# Patient Record
Sex: Female | Born: 1935 | Race: White | Hispanic: No | Marital: Married | Smoking: Former smoker
Health system: Southern US, Community
[De-identification: ages and names within clinical notes are randomized; demographics above are authoritative.]

## PROBLEM LIST (undated history)

## (undated) DIAGNOSIS — E079 Disorder of thyroid, unspecified: Secondary | ICD-10-CM

## (undated) DIAGNOSIS — C801 Malignant (primary) neoplasm, unspecified: Secondary | ICD-10-CM

## (undated) DIAGNOSIS — H269 Unspecified cataract: Secondary | ICD-10-CM

## (undated) DIAGNOSIS — I10 Essential (primary) hypertension: Secondary | ICD-10-CM

## (undated) DIAGNOSIS — K5790 Diverticulosis of intestine, part unspecified, without perforation or abscess without bleeding: Secondary | ICD-10-CM

## (undated) DIAGNOSIS — K635 Polyp of colon: Secondary | ICD-10-CM

## (undated) DIAGNOSIS — K5792 Diverticulitis of intestine, part unspecified, without perforation or abscess without bleeding: Secondary | ICD-10-CM

## (undated) HISTORY — DX: Polyp of colon: K63.5

## (undated) HISTORY — DX: Diverticulosis of intestine, part unspecified, without perforation or abscess without bleeding: K57.90

## (undated) HISTORY — DX: Disorder of thyroid, unspecified: E07.9

## (undated) HISTORY — DX: Essential (primary) hypertension: I10

## (undated) HISTORY — PX: COLONOSCOPY: SHX174

## (undated) HISTORY — DX: Malignant (primary) neoplasm, unspecified: C80.1

## (undated) HISTORY — DX: Diverticulitis of intestine, part unspecified, without perforation or abscess without bleeding: K57.92

## (undated) HISTORY — PX: POLYPECTOMY: SHX149

## (undated) HISTORY — DX: Unspecified cataract: H26.9

---
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
## 1974-04-18 HISTORY — PX: BREAST EXCISIONAL BIOPSY: SUR124

## 1979-04-19 HISTORY — PX: ABDOMINAL HYSTERECTOMY: SHX81

## 1998-11-04 ENCOUNTER — Inpatient Hospital Stay (HOSPITAL_COMMUNITY): Admission: EM | Admit: 1998-11-04 | Discharge: 1998-11-10 | Payer: Self-pay | Admitting: Emergency Medicine

## 1998-11-05 ENCOUNTER — Encounter: Payer: Self-pay | Admitting: General Surgery

## 1998-11-09 ENCOUNTER — Encounter: Payer: Self-pay | Admitting: *Deleted

## 2000-04-18 HISTORY — PX: COLON RESECTION: SHX5231

## 2000-05-03 ENCOUNTER — Encounter: Payer: Self-pay | Admitting: *Deleted

## 2000-05-03 ENCOUNTER — Encounter: Admission: RE | Admit: 2000-05-03 | Discharge: 2000-05-03 | Payer: Self-pay | Admitting: *Deleted

## 2001-01-16 HISTORY — PX: COLON RESECTION: SHX5231

## 2001-03-20 ENCOUNTER — Other Ambulatory Visit: Admission: RE | Admit: 2001-03-20 | Discharge: 2001-03-20 | Payer: Self-pay | Admitting: *Deleted

## 2001-05-03 ENCOUNTER — Encounter: Payer: Self-pay | Admitting: *Deleted

## 2001-05-03 ENCOUNTER — Encounter: Admission: RE | Admit: 2001-05-03 | Discharge: 2001-05-03 | Payer: Self-pay | Admitting: *Deleted

## 2001-09-12 ENCOUNTER — Other Ambulatory Visit: Admission: RE | Admit: 2001-09-12 | Discharge: 2001-09-12 | Payer: Self-pay | Admitting: *Deleted

## 2002-03-27 ENCOUNTER — Other Ambulatory Visit: Admission: RE | Admit: 2002-03-27 | Discharge: 2002-03-27 | Payer: Self-pay

## 2002-05-06 ENCOUNTER — Encounter: Admission: RE | Admit: 2002-05-06 | Discharge: 2002-05-06 | Payer: Self-pay

## 2003-06-20 ENCOUNTER — Encounter: Admission: RE | Admit: 2003-06-20 | Discharge: 2003-06-20 | Payer: Self-pay | Admitting: Family Medicine

## 2003-12-30 ENCOUNTER — Other Ambulatory Visit: Admission: RE | Admit: 2003-12-30 | Discharge: 2003-12-30 | Payer: Self-pay | Admitting: Family Medicine

## 2004-06-22 ENCOUNTER — Encounter: Admission: RE | Admit: 2004-06-22 | Discharge: 2004-06-22 | Payer: Self-pay | Admitting: Family Medicine

## 2004-10-31 ENCOUNTER — Emergency Department (HOSPITAL_COMMUNITY): Admission: EM | Admit: 2004-10-31 | Discharge: 2004-10-31 | Payer: Self-pay | Admitting: Emergency Medicine

## 2005-07-18 ENCOUNTER — Encounter: Admission: RE | Admit: 2005-07-18 | Discharge: 2005-07-18 | Payer: Self-pay | Admitting: Family Medicine

## 2006-08-10 ENCOUNTER — Encounter: Admission: RE | Admit: 2006-08-10 | Discharge: 2006-08-10 | Payer: Self-pay | Admitting: Family Medicine

## 2007-02-28 ENCOUNTER — Ambulatory Visit: Payer: Self-pay | Admitting: Vascular Surgery

## 2007-08-13 ENCOUNTER — Encounter: Admission: RE | Admit: 2007-08-13 | Discharge: 2007-08-13 | Payer: Self-pay | Admitting: Family Medicine

## 2008-08-19 ENCOUNTER — Encounter: Admission: RE | Admit: 2008-08-19 | Discharge: 2008-08-19 | Payer: Self-pay | Admitting: Family Medicine

## 2008-12-17 HISTORY — PX: VAGINAL PROLAPSE REPAIR: SHX830

## 2009-08-20 ENCOUNTER — Encounter: Admission: RE | Admit: 2009-08-20 | Discharge: 2009-08-20 | Payer: Self-pay | Admitting: Family Medicine

## 2010-07-26 ENCOUNTER — Other Ambulatory Visit: Payer: Self-pay | Admitting: Specialist

## 2010-07-26 ENCOUNTER — Other Ambulatory Visit: Payer: Self-pay | Admitting: Family Medicine

## 2010-07-26 DIAGNOSIS — Z1231 Encounter for screening mammogram for malignant neoplasm of breast: Secondary | ICD-10-CM

## 2010-07-30 ENCOUNTER — Other Ambulatory Visit: Payer: Self-pay | Admitting: Dermatology

## 2010-08-31 NOTE — Assessment & Plan Note (Signed)
OFFICE VISIT   Susan Logan, Susan Logan  DOB:  06/16/1935                                       02/28/2007  EAVWU#:[id number]   The patient is a 75 year old female who has had mild to moderate right  calf pain in 3 areas, which has been lasting for the last 6 to 8 months.  She states the pain is fairly minor overall, 2 to 3/10.  Pain does not  change with position.  She does not notice any difference in the pain  with elevation.  The pain can occur just as easily with walking as it  can with sitting still.   Atherosclerotic risk factors include hypertension and elevated  cholesterol.  She denies history of diabetes.   PAST MEDICAL HISTORY:  Remarkable for hypothyroidism and otherwise is  fairly unremarkable.   MEDICATIONS:  Include Synthroid 150 mcg per day, Premarin 0.625 mg once  a day, hydrochlorothiazide 25 mg once a day, glucosamine chondroitin  1500/1200 two per day, Centrum Silver 1 a day, selenium 200 mcg once a  day, vitamins D and C, calcium 600 mg 2 per day, fish oil 1000 mg 2 per  day, ginseng and gingko once a day.   She has no known drug allergies.   PAST SURGICAL HISTORY:  She has had an appendectomy and a colon  resection for diverticulitis.   FAMILY HISTORY:  Unremarkable.   SOCIAL HISTORY:  She is married, has 4 children.  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  She is a former smoker who quit in 1989  after 20 years of smoking.  She drinks 1 glass of wine in the evening.   REVIEW OF SYSTEMS:  She is 5 feet 5 inches, 144 pounds.  She denies any history of DVT or thrombophlebitis in her lower  extremities.  Review of systems from a cardiac, pulmonary, GI, GU, neurological,  orthopedic, psychiatric, ENT, and hematologic standpoint are all  negative.   PHYSICAL EXAM:  Blood pressure is 146/87 in the left arm, heart rate is  70 and regular.  HEENT is unremarkable.  Neck has 2+ carotid pulses with  no bruit bilaterally.  2+  carotid pulses.  Chest is clear to  auscultation.  Cardiac exam is regular rate and rhythm.  Abdomen is soft  and nontender, nondistended with normoactive bowel sounds.  She has 2+  brachial, radial, femoral, popliteal, and dorsalis pedis pulses  bilaterally.  She has a few scattered varicosities and reticular veins  around the right calf.  The left calf is similar.  She has a few areas  of denting in the skin and subcutaneous tissues along the right medial  calf.  She has no obvious mass.  She states that the right leg sometimes  is more swollen than the left.  However, they are fairly symmetric  today.  She had a venous duplex exam today, which showed no evidence of  deep or superficial thrombosis of her venous system.  She did have 2  small perforators in the right calf, which were incompetent.   The patient has not had any evidence of DVT.  She may have some mild  pain related to these incompetent perforators.  However, I would not  consider any intervention, as these are fairly minor in nature.  I  believe the best course of  action is compression stockings for both  lower extremities.  She should get some symptomatic relief from this.  I  have also encouraged her that she can take a nonsteroidal  antiinflammatory such as ibuprofen if she has flare-ups of pain.  I have  also encouraged her to continue to elevate her legs at the end of the  day for symptomatic relief.  She will follow up on an as needed basis.   Susan Hora. Fields, MD  Electronically Signed   CEF/MEDQ  D:  02/28/2007  T:  03/01/2007  Job:  [id number]   cc:   Talmadge Coventry, M.D.

## 2010-08-31 NOTE — Procedures (Signed)
DUPLEX DEEP VENOUS EXAM - LOWER EXTREMITY   INDICATION:  Right medial calf pain, left posterior calf pain   HISTORY:  Edema:  No  Trauma/Surgery:  No  Pain:  For about last 9 months, has had right constant medial calf pain  PE:  No  Previous DVT:  No  Anticoagulants:  No   DUPLEX EXAM:                CFV   SFV   PopV  PTV    GSV                R  L  R  L  R  L  R   L  R  L  Thrombosis    o  o  o  o  o  o  o   o  o  o  Spontaneous   +  +  +  +  +  +  +   +  +  +  Phasic        +  +  +  +  +  +  +   +  +  +  Augmentation  +  +  +  +  +  +  +   +  +  +  Compressible  +  +  +  +  +  +  +   +  +  +  Competent     +  +  +  +  +  +  +   +  +  +   Legend:  + - yes  o - no  p - partial  D - decreased   IMPRESSION:  1. The bilateral lower extremity venous system was imaged, Dopplered,      and shows no evidence of DVT or SVT  2. Two small perforators noted in the right medial calf measuring 0.20      cm and 0.25 cm  3. Incidental note:  Triphasic flow noted in bilateral posterior      tibial arteries    _____________________________  Janetta Hora. Fields, MD   AS/MEDQ  D:  02/28/2007  T:  03/01/2007  Job:  [id number]

## 2010-09-07 ENCOUNTER — Ambulatory Visit
Admission: RE | Admit: 2010-09-07 | Discharge: 2010-09-07 | Disposition: A | Discharge status: Home / Self Care | Payer: Medicare Other | Source: Ambulatory Visit | Attending: Family Medicine | Admitting: Family Medicine

## 2010-09-07 DIAGNOSIS — Z1231 Encounter for screening mammogram for malignant neoplasm of breast: Secondary | ICD-10-CM

## 2010-10-17 HISTORY — PX: OTHER SURGICAL HISTORY: SHX169

## 2011-10-10 ENCOUNTER — Other Ambulatory Visit: Payer: Self-pay | Admitting: Family Medicine

## 2011-10-10 DIAGNOSIS — Z1231 Encounter for screening mammogram for malignant neoplasm of breast: Secondary | ICD-10-CM

## 2011-11-11 ENCOUNTER — Ambulatory Visit
Admission: RE | Admit: 2011-11-11 | Discharge: 2011-11-11 | Disposition: A | Discharge status: Home / Self Care | Payer: Medicare Other | Source: Ambulatory Visit | Attending: Family Medicine | Admitting: Family Medicine

## 2011-11-11 DIAGNOSIS — Z1231 Encounter for screening mammogram for malignant neoplasm of breast: Secondary | ICD-10-CM

## 2012-07-02 ENCOUNTER — Encounter: Payer: Self-pay | Admitting: Internal Medicine

## 2012-07-02 ENCOUNTER — Telehealth: Payer: Self-pay | Admitting: Internal Medicine

## 2012-07-02 NOTE — Telephone Encounter (Signed)
The cutoff is generally age 75 or less for first degree relatives, for colonoscopies every 5 years. Regardless, she is due this year and could have it performed any time. Please update her record indicates that her father was diagnosed with colon cancer at age 59. Thank you

## 2012-07-02 NOTE — Telephone Encounter (Signed)
Spoke with pt and she is aware. States she will call back to schedule the colon when she has her calendar.

## 2012-07-02 NOTE — Telephone Encounter (Signed)
Pt states that her father had colon cancer and he was diagnosed at age 75.

## 2012-07-02 NOTE — Telephone Encounter (Signed)
Data entered in epic.

## 2012-07-02 NOTE — Telephone Encounter (Signed)
Pt states that her PCP told her she should have had a colon done 5 years ago due to family history of colon cancer. Pt received a letter stating she was due for a colon recall in September of 2014. Chart pulled and given to Dr. Marina Goodell for review regarding recall date.

## 2012-07-02 NOTE — Telephone Encounter (Signed)
Her chart was reviewed and 2009. Hyperplastic polyp only and 2004. We had no knowledge of a family history of colon cancer documented in her chart, thus the recommendation. What is her family history of colon cancer (what relative?, and diagnosed at what age?). Thanks

## 2012-07-25 ENCOUNTER — Encounter: Payer: Self-pay | Admitting: Internal Medicine

## 2012-08-15 ENCOUNTER — Other Ambulatory Visit: Payer: Self-pay | Admitting: Dermatology

## 2012-08-21 ENCOUNTER — Other Ambulatory Visit: Payer: Self-pay | Admitting: Dermatology

## 2012-08-30 ENCOUNTER — Encounter: Payer: Self-pay | Admitting: Internal Medicine

## 2012-08-30 ENCOUNTER — Ambulatory Visit (AMBULATORY_SURGERY_CENTER): Payer: Medicare Other | Admitting: *Deleted

## 2012-08-30 VITALS — Ht 65.5 in | Wt 156.2 lb

## 2012-08-30 DIAGNOSIS — Z800 Family history of malignant neoplasm of digestive organs: Secondary | ICD-10-CM

## 2012-08-30 DIAGNOSIS — Z1211 Encounter for screening for malignant neoplasm of colon: Secondary | ICD-10-CM

## 2012-08-30 DIAGNOSIS — Z860100 Personal history of colon polyps, unspecified: Secondary | ICD-10-CM

## 2012-08-30 MED ORDER — MOVIPREP 100 G PO SOLR: 1.0000 | Freq: Once | ORAL | Status: DC

## 2012-08-30 NOTE — Progress Notes (Signed)
No soy or egg allergy. ewm No problems with sedation in the past. ewm  no home 02 use. ewm Dr Marina Goodell did past procedures. ewm

## 2012-09-11 ENCOUNTER — Encounter: Payer: Medicare Other | Admitting: Internal Medicine

## 2012-09-17 ENCOUNTER — Encounter: Payer: Medicare Other | Admitting: Internal Medicine

## 2012-09-25 ENCOUNTER — Encounter: Payer: Medicare Other | Admitting: Internal Medicine

## 2012-10-15 ENCOUNTER — Other Ambulatory Visit: Payer: Self-pay

## 2012-10-15 DIAGNOSIS — Z1231 Encounter for screening mammogram for malignant neoplasm of breast: Secondary | ICD-10-CM

## 2012-10-29 ENCOUNTER — Encounter: Payer: Self-pay | Admitting: Internal Medicine

## 2012-10-29 ENCOUNTER — Ambulatory Visit (AMBULATORY_SURGERY_CENTER): Payer: 59 | Admitting: Internal Medicine

## 2012-10-29 VITALS — BP 139/71 | HR 49 | Temp 96.4°F | Resp 47 | Ht 65.5 in | Wt 156.0 lb

## 2012-10-29 DIAGNOSIS — D126 Benign neoplasm of colon, unspecified: Secondary | ICD-10-CM

## 2012-10-29 DIAGNOSIS — Z1211 Encounter for screening for malignant neoplasm of colon: Secondary | ICD-10-CM

## 2012-10-29 DIAGNOSIS — Z86010 Personal history of colonic polyps: Secondary | ICD-10-CM

## 2012-10-29 DIAGNOSIS — Z800 Family history of malignant neoplasm of digestive organs: Secondary | ICD-10-CM

## 2012-10-29 MED ORDER — SODIUM CHLORIDE 0.9 % IV SOLN: 500.0000 mL | INTRAVENOUS | Status: DC

## 2012-10-29 NOTE — Patient Instructions (Addendum)

## 2012-10-29 NOTE — Progress Notes (Signed)
Patient did not experience any of the following events: a burn prior to discharge; a fall within the facility; wrong site/side/patient/procedure/implant event; or a hospital transfer or hospital admission upon discharge from the facility. (G8907) Patient did not have preoperative order for IV antibiotic SSI prophylaxis. (G8918)  

## 2012-10-29 NOTE — Progress Notes (Signed)
DR.PERRY AND CRNA MADE AWARE OF ELEVATED B/P 208/99 AND 190/90.NO NEW ORDERS RECEIVED.

## 2012-10-29 NOTE — Progress Notes (Signed)
Called to room to assist during endoscopic procedure.  Patient ID and intended procedure confirmed with present staff. Received instructions for my participation in the procedure from the performing physician.  

## 2012-10-29 NOTE — Op Note (Signed)
[facility] [address] [place], [address]   COLONOSCOPY PROCEDURE REPORT  PATIENT: Susan Logan, Susan B.  MR#: [id number] BIRTHDATE: 12/05/1935 , 77  yrs. old GENDER: Female ENDOSCOPIST: Roxy Cedar, MD REFERRED YQ:[id number] Recall PROCEDURE DATE:  10/29/2012 PROCEDURE:   Colonoscopy with snare polypectomy x 4 First Screening Colonoscopy - Avg.  risk and is 50 yrs.  old or older - No.  Prior Negative Screening - Now for repeat screening. Greater than 10 yrs  History of Adenoma - Now for follow-up colonoscopy & has been > or = to 3 yrs.  N/A  Polyps Removed Today? Yes. ASA CLASS:   Class II INDICATIONS:Patient's immediate family history of colon cancer (Father 39 or 64). Prior exams 1999 and 2004 ([facility] only) MEDICATIONS: MAC sedation, administered by CRNA and propofol (Diprivan) 300mg  IV  DESCRIPTION OF PROCEDURE:   After the risks benefits and alternatives of the procedure were thoroughly explained, informed consent was obtained.  A digital rectal exam revealed no abnormalities of the rectum.   The LB [id number]  endoscope was introduced through the anus and advanced to the cecum, which was identified by both the appendix and ileocecal valve. No adverse events experienced.   The quality of the prep was excellent, using MoviPrep  The instrument was then slowly withdrawn as the colon was fully examined.  COLON FINDINGS: Four diminutive polyps were found at the cecum, ascending , transverse colon, and rectum.  A polypectomy was performed with a cold snare.  The resection was complete and the polyp tissue was completely retrieved.   Moderate diverticulosis was noted  in the left colon.   There was evidence of a prior colo-colonic surgical anastomosis in the sigmoid colon at 15cm. Retroflexed views revealed no abnormalities. The time to cecum=1 minutes 22 seconds.  Withdrawal time=12 minutes 50 seconds.  The scope was withdrawn and the procedure  completed.  COMPLICATIONS: There were no complications.  ENDOSCOPIC IMPRESSION: 1.   Four diminutive polyps were found at the cecum, in the ascending colon, transverse colon, and rectum; polypectomy was performed with a cold snare 2.   Moderate diverticulosis was noted in the left colon 3.   There was evidence of a prior colo-colonic surgical anastomosis in the sigmoid colon  RECOMMENDATIONS: 1. Follow up colonoscopy in 5 years, if polyps adenomatous, patient fit and willing   eSigned:  Roxy Cedar, MD 10/29/2012 11:33 AM   cc: The Patient and Gwendlyn Deutscher, MD   PATIENT NAME:  Susan Logan, Susan B. MR#: [id number]

## 2012-10-30 ENCOUNTER — Telehealth: Payer: Self-pay | Admitting: *Deleted

## 2012-10-30 NOTE — Telephone Encounter (Signed)
  Follow up Call-  Call back number 10/29/2012  Post procedure Call Back phone  # [phone]  Permission to leave phone message Yes     Patient questions:  Do you have a fever, pain , or abdominal swelling? no Pain Score  0 *  Have you tolerated food without any problems? yes  Have you been able to return to your normal activities? yes  Do you have any questions about your discharge instructions: Diet   no Medications  no Follow up visit  no  Do you have questions or concerns about your Care? no  Actions: * If pain score is 4 or above: No action needed, pain <4.

## 2012-11-05 ENCOUNTER — Encounter: Payer: Self-pay | Admitting: Internal Medicine

## 2012-11-21 ENCOUNTER — Ambulatory Visit
Admission: RE | Admit: 2012-11-21 | Discharge: 2012-11-21 | Disposition: A | Discharge status: Home / Self Care | Payer: Medicare Other | Source: Ambulatory Visit

## 2012-11-21 DIAGNOSIS — Z1231 Encounter for screening mammogram for malignant neoplasm of breast: Secondary | ICD-10-CM

## 2013-07-18 DIAGNOSIS — B351 Tinea unguium: Secondary | ICD-10-CM

## 2013-08-27 ENCOUNTER — Other Ambulatory Visit: Payer: Self-pay | Admitting: Dermatology

## 2013-08-28 ENCOUNTER — Other Ambulatory Visit: Payer: Self-pay

## 2013-08-28 ENCOUNTER — Telehealth: Payer: Self-pay | Admitting: *Deleted

## 2013-08-28 DIAGNOSIS — B351 Tinea unguium: Secondary | ICD-10-CM

## 2013-08-28 MED ORDER — EFINACONAZOLE 10 % EX SOLN: CUTANEOUS | Status: DC

## 2013-08-28 NOTE — Telephone Encounter (Signed)
I forwarded a prescription for Jublia a to [place] as requested. Please contact patient advised to the prescription has been called in however she can come by the office at some time for pickup a coupon for the [facility] for possible discount if she is prescription currently covers her medication her [facility] to be [place] not there is also a mail order pharmacy, [facility] which will provide the medication and he discount for those people with no insurance whatsoever please advised the patient of this option  Harriet Masson DPM

## 2013-08-28 NOTE — Progress Notes (Signed)
Patient did request for Jublia a topical antifungal medication to be called in to [place]. We'll switch from formula 3 OTC medication to prescription Jublia  Patient instructed to apply once daily to each affected toenail for 12 months as instructed

## 2013-08-28 NOTE — Telephone Encounter (Signed)
Been using Formula 3 for toenail fungus.  Doesn't seem to be doing a whole lot of good.  I saw that commercial about Jublia.  I'd like a prescription for Jublia.  Can you call that to my pharmacy, [facility] in [place]?  Let me know that this has been done.

## 2013-08-29 NOTE — Telephone Encounter (Signed)
Spoke with the patient this morning and she stated that she got a prescription coupon off line and will go to [facility] [place] to get the Jublia. Susan Logan

## 2013-10-23 ENCOUNTER — Other Ambulatory Visit: Payer: Self-pay

## 2013-10-23 DIAGNOSIS — Z1231 Encounter for screening mammogram for malignant neoplasm of breast: Secondary | ICD-10-CM

## 2013-11-26 ENCOUNTER — Ambulatory Visit: Payer: Medicare Other

## 2013-11-29 ENCOUNTER — Ambulatory Visit
Admission: RE | Admit: 2013-11-29 | Discharge: 2013-11-29 | Disposition: A | Discharge status: Home / Self Care | Payer: 59 | Source: Ambulatory Visit

## 2013-11-29 ENCOUNTER — Encounter (INDEPENDENT_AMBULATORY_CARE_PROVIDER_SITE_OTHER): Payer: Self-pay

## 2013-11-29 DIAGNOSIS — Z1231 Encounter for screening mammogram for malignant neoplasm of breast: Secondary | ICD-10-CM

## 2014-01-30 ENCOUNTER — Encounter: Payer: Self-pay | Admitting: Internal Medicine

## 2014-10-24 ENCOUNTER — Other Ambulatory Visit: Payer: Self-pay

## 2014-10-24 DIAGNOSIS — Z1231 Encounter for screening mammogram for malignant neoplasm of breast: Secondary | ICD-10-CM

## 2014-12-08 ENCOUNTER — Ambulatory Visit (INDEPENDENT_AMBULATORY_CARE_PROVIDER_SITE_OTHER): Payer: 59 | Admitting: Podiatry

## 2014-12-08 ENCOUNTER — Ambulatory Visit
Admission: RE | Admit: 2014-12-08 | Discharge: 2014-12-08 | Disposition: A | Discharge status: Home / Self Care | Payer: Medicare Other | Source: Ambulatory Visit

## 2014-12-08 ENCOUNTER — Encounter: Payer: Self-pay | Admitting: Podiatry

## 2014-12-08 VITALS — BP 128/70 | HR 61 | Resp 16

## 2014-12-08 DIAGNOSIS — Z1231 Encounter for screening mammogram for malignant neoplasm of breast: Secondary | ICD-10-CM

## 2014-12-08 DIAGNOSIS — L600 Ingrowing nail: Secondary | ICD-10-CM | POA: Diagnosis not present

## 2014-12-08 DIAGNOSIS — M79673 Pain in unspecified foot: Secondary | ICD-10-CM

## 2014-12-08 DIAGNOSIS — B351 Tinea unguium: Secondary | ICD-10-CM | POA: Diagnosis not present

## 2014-12-08 NOTE — Progress Notes (Signed)
   Subjective:    Patient ID: Susan Logan, female    DOB: 09-21-1935, 79 y.o.   MRN: [id number]  HPI Patient presents with bilateral nail fungus. On Left foot; great toe, 4th and 5th toes; On Right foot; 4th toe. Pt wants to discuss getting laser treatment. This has been going on for years.   Review of Systems  All other systems reviewed and are negative.      Objective:   Physical Exam        Assessment & Plan:

## 2014-12-08 NOTE — Progress Notes (Signed)
Subjective:     Patient ID: Susan Logan, female   DOB: 08-16-1935, 79 y.o.   MRN: [id number]  HPI patient states the left big nail the fourth and fifth nails left and slightly on the right big toenail there is discoloration with yellow like appearance and brittleness of the fourth and fifth nails of the left foot. Patient wants to know what options are available   Review of Systems  All other systems reviewed and are negative.      Objective:   Physical Exam  Constitutional: She is oriented to person, place, and time.  Cardiovascular: Intact distal pulses.   Musculoskeletal: Normal range of motion.  Neurological: She is oriented to person, place, and time.  Skin: Skin is warm.  Nursing note and vitals reviewed.  neurovascular status intact muscle strength adequate range of motion within normal limits with patient noted to have yellow discoloration hallux nail left fourth and fifth nails left with thickness of the nailbeds and mildly on the hallux nail right foot. Patient has good digital perfusion is well oriented 3     Assessment:     Mycotic nail infection 4 nails 3 on the left    Plan:     H&P and condition reviewed. I discussed pulse Lamisil therapy along with topical and consideration for laser. Patient is just can ago with topical the current time and decide on oral or laser in the near future. Patient was given full explanation to condition and I spent a great of time educating her on this problem

## 2015-11-06 ENCOUNTER — Other Ambulatory Visit: Payer: Self-pay | Admitting: Family Medicine

## 2015-11-06 DIAGNOSIS — Z1231 Encounter for screening mammogram for malignant neoplasm of breast: Secondary | ICD-10-CM

## 2015-12-09 ENCOUNTER — Ambulatory Visit: Payer: Medicare Other

## 2015-12-29 ENCOUNTER — Ambulatory Visit
Admission: RE | Admit: 2015-12-29 | Discharge: 2015-12-29 | Disposition: A | Discharge status: Home / Self Care | Payer: Medicare Other | Source: Ambulatory Visit | Attending: Family Medicine | Admitting: Family Medicine

## 2015-12-29 DIAGNOSIS — Z1231 Encounter for screening mammogram for malignant neoplasm of breast: Secondary | ICD-10-CM

## 2016-11-24 ENCOUNTER — Other Ambulatory Visit: Payer: Self-pay | Admitting: Family Medicine

## 2016-11-24 DIAGNOSIS — Z1231 Encounter for screening mammogram for malignant neoplasm of breast: Secondary | ICD-10-CM

## 2017-01-05 ENCOUNTER — Ambulatory Visit
Admission: RE | Admit: 2017-01-05 | Discharge: 2017-01-05 | Disposition: A | Discharge status: Home / Self Care | Payer: Medicare Other | Source: Ambulatory Visit | Attending: Family Medicine | Admitting: Family Medicine

## 2017-01-05 DIAGNOSIS — Z1231 Encounter for screening mammogram for malignant neoplasm of breast: Secondary | ICD-10-CM

## 2017-09-06 ENCOUNTER — Encounter: Payer: Self-pay | Admitting: Internal Medicine

## 2017-09-26 ENCOUNTER — Encounter: Payer: Self-pay | Admitting: Internal Medicine

## 2017-11-13 ENCOUNTER — Ambulatory Visit (AMBULATORY_SURGERY_CENTER): Payer: Self-pay

## 2017-11-13 VITALS — Ht 65.5 in | Wt 147.6 lb

## 2017-11-13 DIAGNOSIS — Z86010 Personal history of colonic polyps: Secondary | ICD-10-CM

## 2017-11-13 MED ORDER — NA SULFATE-K SULFATE-MG SULF 17.5-3.13-1.6 GM/177ML PO SOLN: 1.0000 | Freq: Once | ORAL | 0 refills | Status: AC

## 2017-11-13 NOTE — Progress Notes (Signed)
Denies allergies to eggs or soy products. Denies complication of anesthesia or sedation. Denies use of weight loss medication. Denies use of O2.   Emmi instructions declined.  

## 2017-11-14 ENCOUNTER — Encounter: Payer: Self-pay | Admitting: Internal Medicine

## 2017-11-27 ENCOUNTER — Other Ambulatory Visit: Payer: Self-pay | Admitting: Family Medicine

## 2017-11-27 DIAGNOSIS — Z1231 Encounter for screening mammogram for malignant neoplasm of breast: Secondary | ICD-10-CM

## 2017-11-28 ENCOUNTER — Encounter: Payer: Self-pay | Admitting: Internal Medicine

## 2017-11-28 ENCOUNTER — Ambulatory Visit (AMBULATORY_SURGERY_CENTER): Payer: Medicare Other | Admitting: Internal Medicine

## 2017-11-28 VITALS — BP 125/78 | HR 54 | Temp 98.4°F | Resp 15 | Ht 65.0 in | Wt 147.0 lb

## 2017-11-28 DIAGNOSIS — Z86010 Personal history of colonic polyps: Secondary | ICD-10-CM

## 2017-11-28 DIAGNOSIS — K635 Polyp of colon: Secondary | ICD-10-CM

## 2017-11-28 DIAGNOSIS — D122 Benign neoplasm of ascending colon: Secondary | ICD-10-CM

## 2017-11-28 DIAGNOSIS — Z800 Family history of malignant neoplasm of digestive organs: Secondary | ICD-10-CM | POA: Diagnosis not present

## 2017-11-28 MED ORDER — SODIUM CHLORIDE 0.9 % IV SOLN: 500.0000 mL | Freq: Once | INTRAVENOUS | Status: DC

## 2017-11-28 NOTE — Progress Notes (Signed)
Called to room to assist during endoscopic procedure.  Patient ID and intended procedure confirmed with present staff. Received instructions for my participation in the procedure from the performing physician.  

## 2017-11-28 NOTE — Progress Notes (Signed)
Report to PACU, RN, vss, BBS= Clear.  

## 2017-11-28 NOTE — Progress Notes (Signed)
Pt's states no medical or surgical changes since previsit or office visit. 

## 2017-11-28 NOTE — Op Note (Signed)
[facility] Patient Name: Susan Logan Procedure Date: 11/28/2017 2:16 PM MRN: [id number] Endoscopist: Docia Chuck. Henrene Pastor , MD Age: 82 Referring MD:  Date of Birth: December 16, 1935 Gender: Female Account #: [id number] Procedure:                Colonoscopy, with cold snare polypectomy x 2 Indications:              High risk colon cancer surveillance: Personal                            history of non-advanced adenoma. Previous                            examinations 1999, 2004, 2014. Father with colon                            cancer in her early 19s. Son with colon cancer at                            39. The patient with prior sigmoid colectomy due to                            diverticular disease Medicines:                Monitored Anesthesia Care Procedure:                Pre-Anesthesia Assessment:                           - Prior to the procedure, a History and Physical                            was performed, and patient medications and                            allergies were reviewed. The patient's tolerance of                            previous anesthesia was also reviewed. The risks                            and benefits of the procedure and the sedation                            options and risks were discussed with the patient.                            All questions were answered, and informed consent                            was obtained. Prior Anticoagulants: The patient has                            taken no previous anticoagulant or antiplatelet  agents. ASA Grade Assessment: II - A patient with                            mild systemic disease. After reviewing the risks                            and benefits, the patient was deemed in                            satisfactory condition to undergo the procedure.                           After obtaining informed consent, the colonoscope                            was passed under  direct vision. Throughout the                            procedure, the patient's blood pressure, pulse, and                            oxygen saturations were monitored continuously. The                            Colonoscope was introduced through the anus and                            advanced to the the cecum, identified by                            appendiceal orifice and ileocecal valve. The                            ileocecal valve, appendiceal orifice, and rectum                            were photographed. The quality of the bowel                            preparation was excellent. The colonoscopy was                            performed without difficulty. The patient tolerated                            the procedure well. The bowel preparation used was                            SUPREP. Scope In: 2:38:59 PM Scope Out: 2:50:02 PM Scope Withdrawal Time: 0 hours 10 minutes 4 seconds  Total Procedure Duration: 0 hours 11 minutes 3 seconds  Findings:                 Two polyps were found in the ascending colon. The  polyps were 2 to 4 mm in size. These polyps were                            removed with a cold snare. Resection and retrieval                            were complete.                           Multiple diverticula were found in the left colon.                           Patient had evidence of prior sigmoid colectomy                            with side an anastomosis at 15 cm. The exam was                            otherwise without abnormality. Retroflexion not                            accomplished due to narrow rectal vault, though                            excellent view on antegrade from the os. Complications:            No immediate complications. Estimated blood loss:                            None. Estimated Blood Loss:     Estimated blood loss: none. Impression:               - Two 2 to 4 mm polyps in the ascending colon,                             removed with a cold snare. Resected and retrieved.                           - Diverticulosis in the left colon. Evidence of                            prior sigmoid colectomy.                           - The examination was otherwise normal on direct                            and retroflexion views. Recommendation:           - Repeat colonoscopy in 5 years for surveillance,                            if patient is motivated and remains excellent  physical health.                           - Patient has a contact number available for                            emergencies. The signs and symptoms of potential                            delayed complications were discussed with the                            patient. Return to normal activities tomorrow.                            Written discharge instructions were provided to the                            patient.                           - Resume previous diet.                           - Continue present medications.                           - Await pathology results. Docia Chuck. Henrene Pastor, MD 11/28/2017 2:56:40 PM This report has been signed electronically.

## 2017-11-28 NOTE — Patient Instructions (Signed)
**   Handouts given on polyps and diverticulosis **   YOU HAD AN ENDOSCOPIC PROCEDURE TODAY AT THE [facility]:   Refer to the procedure report that was given to you for any specific questions about what was found during the examination.  If the procedure report does not answer your questions, please call your gastroenterologist to clarify.  If you requested that your care partner not be given the details of your procedure findings, then the procedure report has been included in a sealed envelope for you to review at your convenience later.  YOU SHOULD EXPECT: Some feelings of bloating in the abdomen. Passage of more gas than usual.  Walking can help get rid of the air that was put into your GI tract during the procedure and reduce the bloating. If you had a lower endoscopy (such as a colonoscopy or flexible sigmoidoscopy) you may notice spotting of blood in your stool or on the toilet paper. If you underwent a bowel prep for your procedure, you may not have a normal bowel movement for a few days.  Please Note:  You might notice some irritation and congestion in your nose or some drainage.  This is from the oxygen used during your procedure.  There is no need for concern and it should clear up in a day or so.  SYMPTOMS TO REPORT IMMEDIATELY:   Following lower endoscopy (colonoscopy or flexible sigmoidoscopy):  Excessive amounts of blood in the stool  Significant tenderness or worsening of abdominal pains  Swelling of the abdomen that is new, acute  Fever of 100F or higher  For urgent or emergent issues, a gastroenterologist can be reached at any hour by calling [phone].   DIET:  We do recommend a small meal at first, but then you may proceed to your regular diet.  Drink plenty of fluids but you should avoid alcoholic beverages for 24 hours.  ACTIVITY:  You should plan to take it easy for the rest of today and you should NOT DRIVE or use heavy machinery until tomorrow (because  of the sedation medicines used during the test).    FOLLOW UP: Our staff will call the number listed on your records the next business day following your procedure to check on you and address any questions or concerns that you may have regarding the information given to you following your procedure. If we do not reach you, we will leave a message.  However, if you are feeling well and you are not experiencing any problems, there is no need to return our call.  We will assume that you have returned to your regular daily activities without incident.  If any biopsies were taken you will be contacted by phone or by letter within the next 1-3 weeks.  Please call us at [phone] if you have not heard about the biopsies in 3 weeks.    SIGNATURES/CONFIDENTIALITY: You and/or your care partner have signed paperwork which will be entered into your electronic medical record.  These signatures attest to the fact that that the information above on your After Visit Summary has been reviewed and is understood.  Full responsibility of the confidentiality of this discharge information lies with you and/or your care-partner. 

## 2017-11-29 ENCOUNTER — Telehealth: Payer: Self-pay

## 2017-11-29 ENCOUNTER — Telehealth: Payer: Self-pay | Admitting: *Deleted

## 2017-11-29 NOTE — Telephone Encounter (Signed)
NO ANSWER, MESSAGE LEFT FOR PATIENT. 

## 2017-11-29 NOTE — Telephone Encounter (Signed)
  Follow up Call-  Call back number 11/28/2017  Post procedure Call Back phone  # [phone]  Permission to leave phone message Yes  Some recent data might be hidden     Patient questions:  Do you have a fever, pain , or abdominal swelling? No. Pain Score  0 *  Have you tolerated food without any problems? Yes.    Have you been able to return to your normal activities? Yes.    Do you have any questions about your discharge instructions: Diet   No. Medications  No. Follow up visit  No.  Do you have questions or concerns about your Care? No.  Actions: * If pain score is 4 or above: No action needed, pain <4.

## 2017-12-05 ENCOUNTER — Encounter: Payer: Self-pay | Admitting: Internal Medicine

## 2018-01-08 ENCOUNTER — Ambulatory Visit: Payer: Medicare Other

## 2018-01-24 ENCOUNTER — Other Ambulatory Visit: Payer: Self-pay | Admitting: Pharmacist

## 2018-01-24 NOTE — Patient Outreach (Signed)
[facility]) Care Management  01/24/2018  Susan Logan Mar 17, 1936 [id number]   Incoming call from [name] in response to the [facility] Medication Adherence Campaign. Speak with patient. HIPAA identifiers verified and verbal consent received.  [name] reports that she takes her Benicar HCT 20-12.5 mg once daily as directed. Denies any recent missed doses or barriers to adherence. States that a couple of months ago, she was having difficulty with getting the medication because she takes the brand name and local pharmacies did not have it in stock. Reports that she was instructed to take the brand name of Benicar by her PCP and that it is covered by her insurance. Reports that she has now been able to get the brand name again. Note that per Epic Medication Dispense Report, patient last had this medication filled on 01/16/18 for a 90 day supply.  [name] denies any medication questions/concerns at this time.  Will close pharmacy episode.  Harlow Asa, PharmD, [place] [facility] [phone]

## 2018-02-07 ENCOUNTER — Ambulatory Visit
Admission: RE | Admit: 2018-02-07 | Discharge: 2018-02-07 | Disposition: A | Discharge status: Home / Self Care | Payer: Medicare Other | Source: Ambulatory Visit | Attending: Family Medicine | Admitting: Family Medicine

## 2018-02-07 DIAGNOSIS — Z1231 Encounter for screening mammogram for malignant neoplasm of breast: Secondary | ICD-10-CM

## 2019-05-09 ENCOUNTER — Other Ambulatory Visit: Payer: Self-pay | Admitting: Family Medicine

## 2019-05-09 DIAGNOSIS — Z1231 Encounter for screening mammogram for malignant neoplasm of breast: Secondary | ICD-10-CM

## 2019-06-19 ENCOUNTER — Ambulatory Visit
Admission: RE | Admit: 2019-06-19 | Discharge: 2019-06-19 | Disposition: A | Discharge status: Home / Self Care | Payer: Medicare PPO | Source: Ambulatory Visit | Attending: Family Medicine | Admitting: Family Medicine

## 2019-06-19 ENCOUNTER — Other Ambulatory Visit: Payer: Self-pay

## 2019-06-19 DIAGNOSIS — Z1231 Encounter for screening mammogram for malignant neoplasm of breast: Secondary | ICD-10-CM | POA: Diagnosis not present

## 2019-07-15 DIAGNOSIS — Z961 Presence of intraocular lens: Secondary | ICD-10-CM | POA: Diagnosis not present

## 2020-03-24 DIAGNOSIS — Z79899 Other long term (current) drug therapy: Secondary | ICD-10-CM | POA: Diagnosis not present

## 2020-03-24 DIAGNOSIS — E039 Hypothyroidism, unspecified: Secondary | ICD-10-CM | POA: Diagnosis not present

## 2020-03-24 DIAGNOSIS — Z1331 Encounter for screening for depression: Secondary | ICD-10-CM | POA: Diagnosis not present

## 2020-03-24 DIAGNOSIS — Z0000 Encounter for general adult medical examination without abnormal findings: Secondary | ICD-10-CM | POA: Diagnosis not present

## 2020-03-24 DIAGNOSIS — E559 Vitamin D deficiency, unspecified: Secondary | ICD-10-CM | POA: Diagnosis not present

## 2020-03-24 DIAGNOSIS — M810 Age-related osteoporosis without current pathological fracture: Secondary | ICD-10-CM | POA: Diagnosis not present

## 2020-03-24 DIAGNOSIS — Z6823 Body mass index (BMI) 23.0-23.9, adult: Secondary | ICD-10-CM | POA: Diagnosis not present

## 2020-05-18 DIAGNOSIS — M5413 Radiculopathy, cervicothoracic region: Secondary | ICD-10-CM | POA: Diagnosis not present

## 2020-05-18 DIAGNOSIS — M546 Pain in thoracic spine: Secondary | ICD-10-CM | POA: Diagnosis not present

## 2020-05-18 DIAGNOSIS — M9901 Segmental and somatic dysfunction of cervical region: Secondary | ICD-10-CM | POA: Diagnosis not present

## 2020-05-18 DIAGNOSIS — M9902 Segmental and somatic dysfunction of thoracic region: Secondary | ICD-10-CM | POA: Diagnosis not present

## 2020-05-18 DIAGNOSIS — M50323 Other cervical disc degeneration at C6-C7 level: Secondary | ICD-10-CM | POA: Diagnosis not present

## 2020-05-20 ENCOUNTER — Other Ambulatory Visit: Payer: Self-pay | Admitting: Family Medicine

## 2020-05-20 DIAGNOSIS — Z1231 Encounter for screening mammogram for malignant neoplasm of breast: Secondary | ICD-10-CM

## 2020-05-21 DIAGNOSIS — M546 Pain in thoracic spine: Secondary | ICD-10-CM | POA: Diagnosis not present

## 2020-05-21 DIAGNOSIS — M5413 Radiculopathy, cervicothoracic region: Secondary | ICD-10-CM | POA: Diagnosis not present

## 2020-05-21 DIAGNOSIS — M9901 Segmental and somatic dysfunction of cervical region: Secondary | ICD-10-CM | POA: Diagnosis not present

## 2020-05-21 DIAGNOSIS — M50323 Other cervical disc degeneration at C6-C7 level: Secondary | ICD-10-CM | POA: Diagnosis not present

## 2020-05-21 DIAGNOSIS — M9902 Segmental and somatic dysfunction of thoracic region: Secondary | ICD-10-CM | POA: Diagnosis not present

## 2020-05-25 DIAGNOSIS — M9902 Segmental and somatic dysfunction of thoracic region: Secondary | ICD-10-CM | POA: Diagnosis not present

## 2020-05-25 DIAGNOSIS — M9901 Segmental and somatic dysfunction of cervical region: Secondary | ICD-10-CM | POA: Diagnosis not present

## 2020-05-25 DIAGNOSIS — M5413 Radiculopathy, cervicothoracic region: Secondary | ICD-10-CM | POA: Diagnosis not present

## 2020-05-25 DIAGNOSIS — M50323 Other cervical disc degeneration at C6-C7 level: Secondary | ICD-10-CM | POA: Diagnosis not present

## 2020-05-25 DIAGNOSIS — M546 Pain in thoracic spine: Secondary | ICD-10-CM | POA: Diagnosis not present

## 2020-07-06 ENCOUNTER — Other Ambulatory Visit: Payer: Self-pay

## 2020-07-06 ENCOUNTER — Ambulatory Visit
Admission: RE | Admit: 2020-07-06 | Discharge: 2020-07-06 | Disposition: A | Discharge status: Home / Self Care | Payer: Medicare PPO | Source: Ambulatory Visit | Attending: Family Medicine | Admitting: Family Medicine

## 2020-07-06 DIAGNOSIS — Z1231 Encounter for screening mammogram for malignant neoplasm of breast: Secondary | ICD-10-CM | POA: Diagnosis not present

## 2020-07-31 DIAGNOSIS — R490 Dysphonia: Secondary | ICD-10-CM | POA: Diagnosis not present

## 2020-07-31 DIAGNOSIS — R0981 Nasal congestion: Secondary | ICD-10-CM | POA: Diagnosis not present

## 2020-07-31 DIAGNOSIS — R059 Cough, unspecified: Secondary | ICD-10-CM | POA: Diagnosis not present

## 2020-08-07 DIAGNOSIS — J329 Chronic sinusitis, unspecified: Secondary | ICD-10-CM | POA: Diagnosis not present

## 2020-08-07 DIAGNOSIS — J40 Bronchitis, not specified as acute or chronic: Secondary | ICD-10-CM | POA: Diagnosis not present

## 2020-11-04 DIAGNOSIS — M7751 Other enthesopathy of right foot: Secondary | ICD-10-CM | POA: Diagnosis not present

## 2020-12-10 DIAGNOSIS — S51852A Open bite of left forearm, initial encounter: Secondary | ICD-10-CM | POA: Diagnosis not present

## 2020-12-31 DIAGNOSIS — H52223 Regular astigmatism, bilateral: Secondary | ICD-10-CM | POA: Diagnosis not present

## 2021-01-13 DIAGNOSIS — D225 Melanocytic nevi of trunk: Secondary | ICD-10-CM | POA: Diagnosis not present

## 2021-01-13 DIAGNOSIS — L821 Other seborrheic keratosis: Secondary | ICD-10-CM | POA: Diagnosis not present

## 2021-01-13 DIAGNOSIS — L814 Other melanin hyperpigmentation: Secondary | ICD-10-CM | POA: Diagnosis not present

## 2021-01-13 DIAGNOSIS — Z419 Encounter for procedure for purposes other than remedying health state, unspecified: Secondary | ICD-10-CM | POA: Diagnosis not present

## 2021-01-13 DIAGNOSIS — D2261 Melanocytic nevi of right upper limb, including shoulder: Secondary | ICD-10-CM | POA: Diagnosis not present

## 2021-01-13 DIAGNOSIS — B078 Other viral warts: Secondary | ICD-10-CM | POA: Diagnosis not present

## 2021-01-13 DIAGNOSIS — Z85828 Personal history of other malignant neoplasm of skin: Secondary | ICD-10-CM | POA: Diagnosis not present

## 2021-01-13 DIAGNOSIS — D2239 Melanocytic nevi of other parts of face: Secondary | ICD-10-CM | POA: Diagnosis not present

## 2021-01-13 DIAGNOSIS — L738 Other specified follicular disorders: Secondary | ICD-10-CM | POA: Diagnosis not present

## 2021-01-20 DIAGNOSIS — H52223 Regular astigmatism, bilateral: Secondary | ICD-10-CM | POA: Diagnosis not present

## 2021-06-04 ENCOUNTER — Other Ambulatory Visit: Payer: Self-pay | Admitting: Family Medicine

## 2021-06-04 DIAGNOSIS — Z1231 Encounter for screening mammogram for malignant neoplasm of breast: Secondary | ICD-10-CM

## 2021-07-13 ENCOUNTER — Other Ambulatory Visit: Payer: Self-pay

## 2021-07-13 ENCOUNTER — Ambulatory Visit
Admission: RE | Admit: 2021-07-13 | Discharge: 2021-07-13 | Disposition: A | Discharge status: Home / Self Care | Payer: Medicare PPO | Source: Ambulatory Visit | Attending: Family Medicine | Admitting: Family Medicine

## 2021-07-13 DIAGNOSIS — Z1231 Encounter for screening mammogram for malignant neoplasm of breast: Secondary | ICD-10-CM

## 2021-08-10 DIAGNOSIS — Z79899 Other long term (current) drug therapy: Secondary | ICD-10-CM | POA: Diagnosis not present

## 2021-08-10 DIAGNOSIS — M810 Age-related osteoporosis without current pathological fracture: Secondary | ICD-10-CM | POA: Diagnosis not present

## 2021-08-10 DIAGNOSIS — I10 Essential (primary) hypertension: Secondary | ICD-10-CM | POA: Diagnosis not present

## 2021-08-10 DIAGNOSIS — E039 Hypothyroidism, unspecified: Secondary | ICD-10-CM | POA: Diagnosis not present

## 2021-08-10 DIAGNOSIS — Z6823 Body mass index (BMI) 23.0-23.9, adult: Secondary | ICD-10-CM | POA: Diagnosis not present

## 2021-08-10 DIAGNOSIS — Z0000 Encounter for general adult medical examination without abnormal findings: Secondary | ICD-10-CM | POA: Diagnosis not present

## 2021-08-10 DIAGNOSIS — Z1331 Encounter for screening for depression: Secondary | ICD-10-CM | POA: Diagnosis not present

## 2021-08-10 DIAGNOSIS — E559 Vitamin D deficiency, unspecified: Secondary | ICD-10-CM | POA: Diagnosis not present

## 2021-10-12 DIAGNOSIS — E039 Hypothyroidism, unspecified: Secondary | ICD-10-CM | POA: Diagnosis not present

## 2022-06-09 ENCOUNTER — Other Ambulatory Visit: Payer: Self-pay | Admitting: Family Medicine

## 2022-06-09 DIAGNOSIS — Z1231 Encounter for screening mammogram for malignant neoplasm of breast: Secondary | ICD-10-CM

## 2022-07-27 ENCOUNTER — Ambulatory Visit
Admission: RE | Admit: 2022-07-27 | Discharge: 2022-07-27 | Disposition: A | Discharge status: Home / Self Care | Payer: Medicare PPO | Source: Ambulatory Visit | Attending: Family Medicine | Admitting: Family Medicine

## 2022-07-27 DIAGNOSIS — Z1231 Encounter for screening mammogram for malignant neoplasm of breast: Secondary | ICD-10-CM | POA: Diagnosis not present

## 2022-09-16 IMAGING — MG MM DIGITAL SCREENING BILAT W/ TOMO AND CAD
8 series · 9 of 24 positions shown · non-contrast
Comparison: Previous exam(s).

CLINICAL DATA: Screening.

EXAM:
DIGITAL SCREENING BILATERAL MAMMOGRAM WITH TOMOSYNTHESIS AND CAD
TECHNIQUE: Bilateral screening digital craniocaudal and mediolateral oblique
mammograms were obtained. Bilateral screening digital breast
tomosynthesis was performed. The images were evaluated with
computer-aided detection.

[L CC synth-2D]
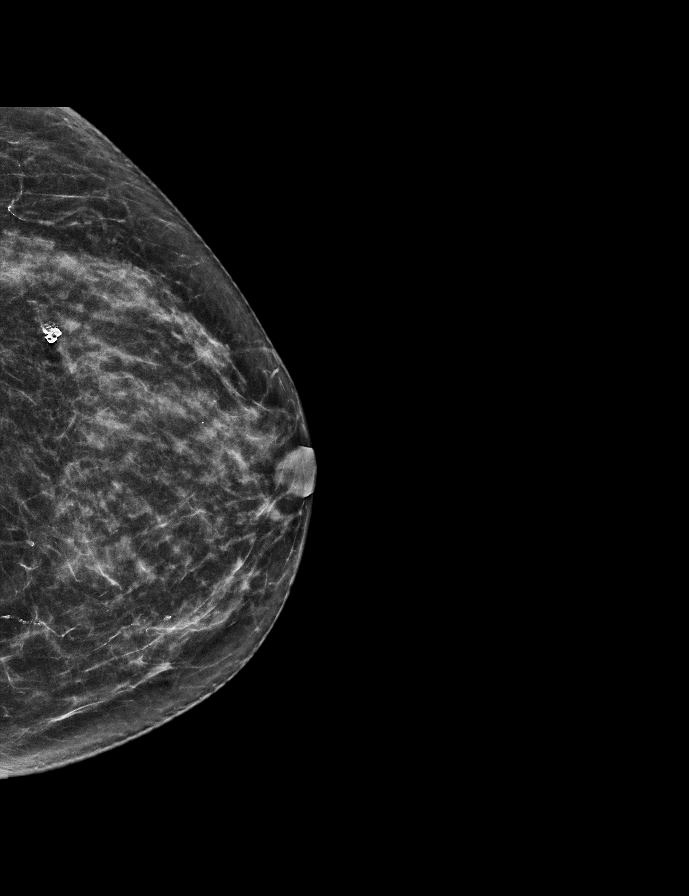

[L MLO synth-2D]
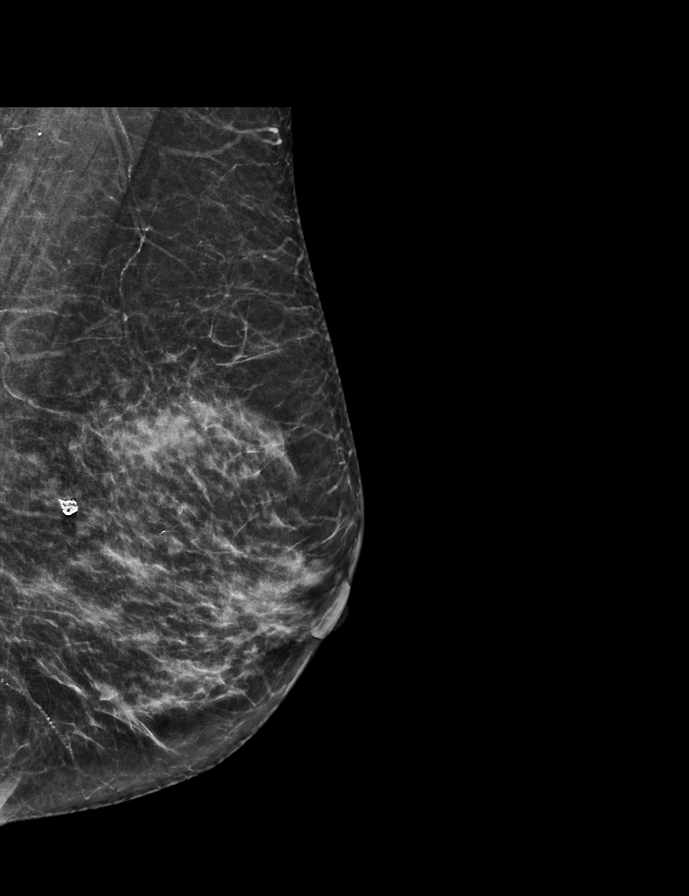

[R CC synth-2D]
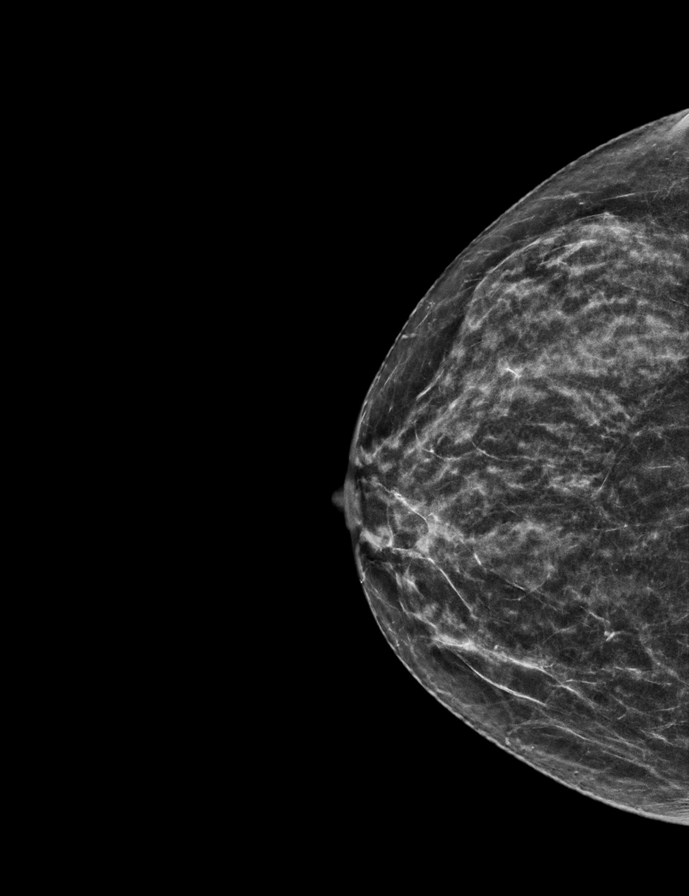

[R MLO synth-2D]
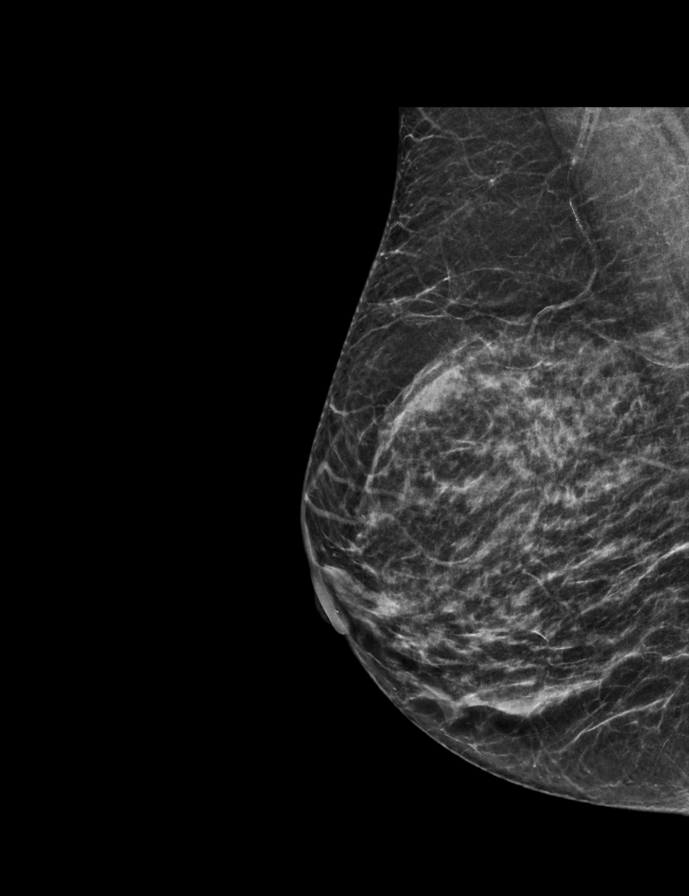

[R MLO tomo · 2 of 49 frames shown]
[frame 16/49]
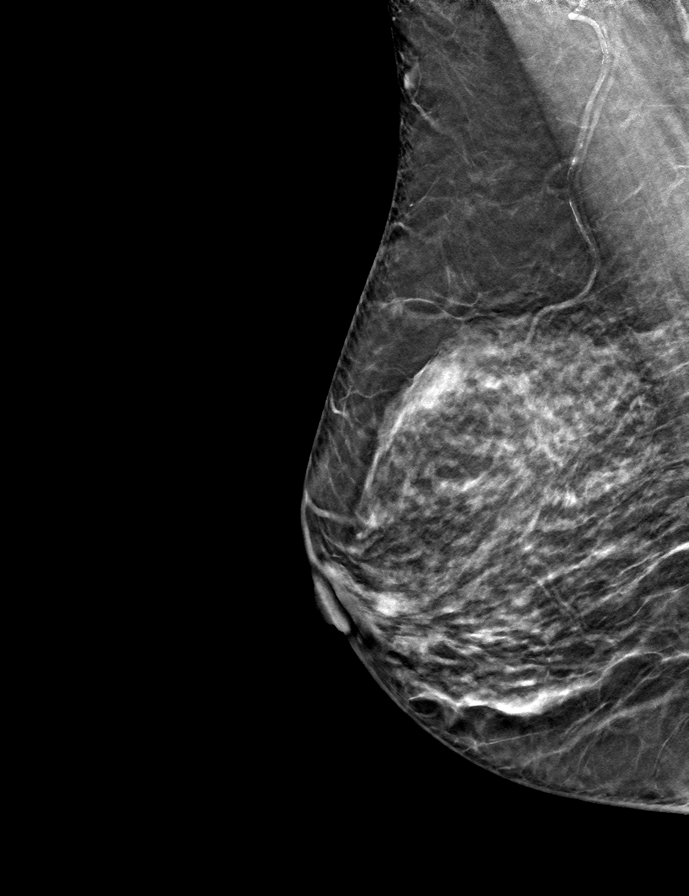
[frame 25/49]
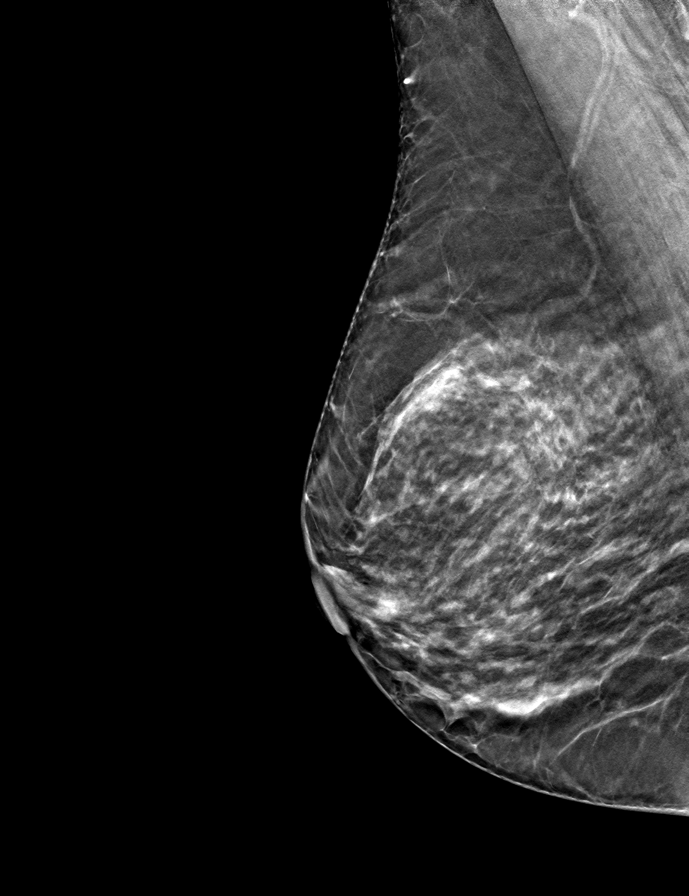

[R CC tomo · tomo slice 26/51.0]
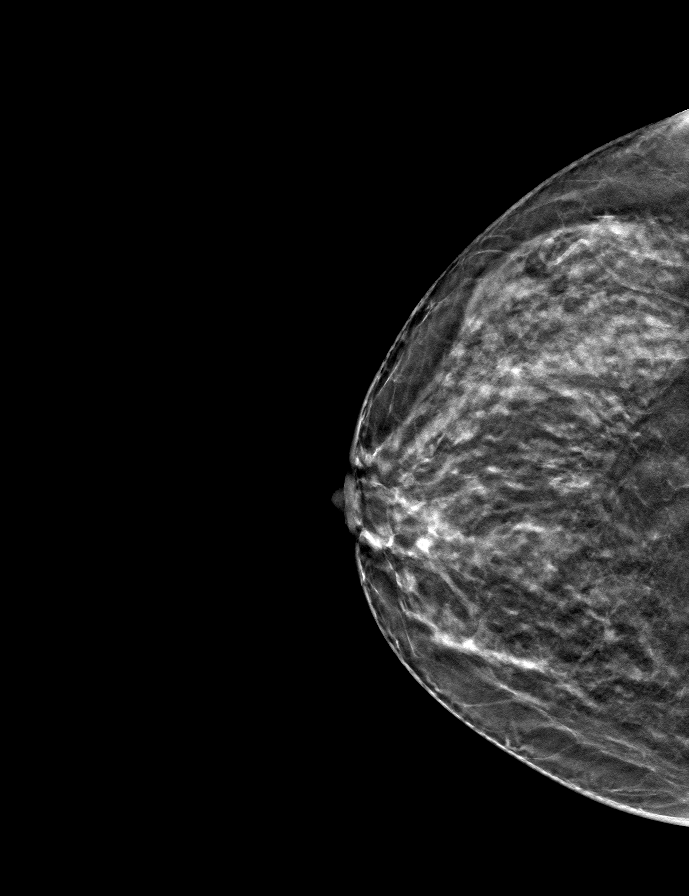

[L CC tomo · tomo slice 27/53.0]
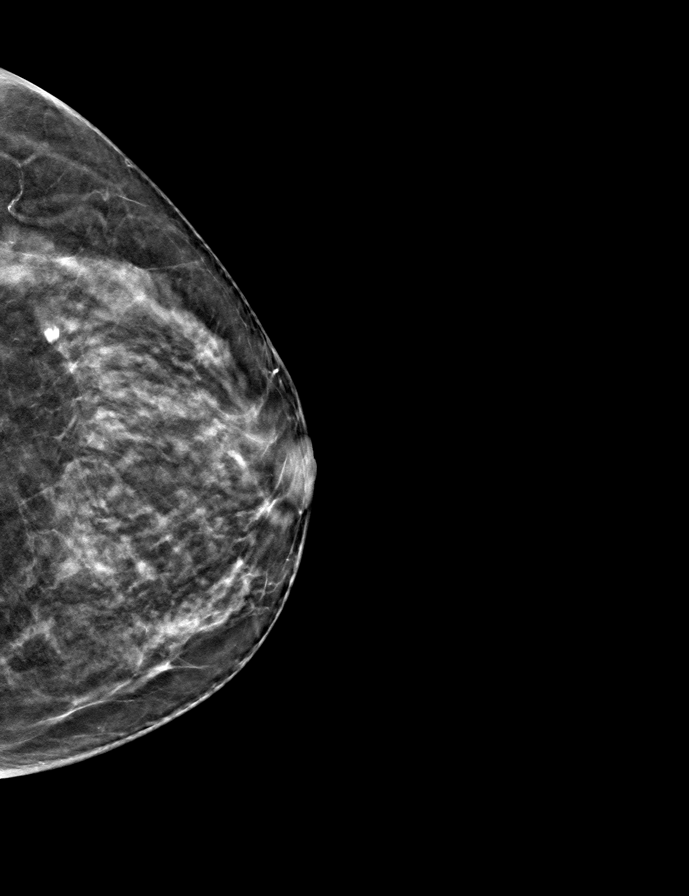

[L MLO tomo · tomo slice 27/52.0]
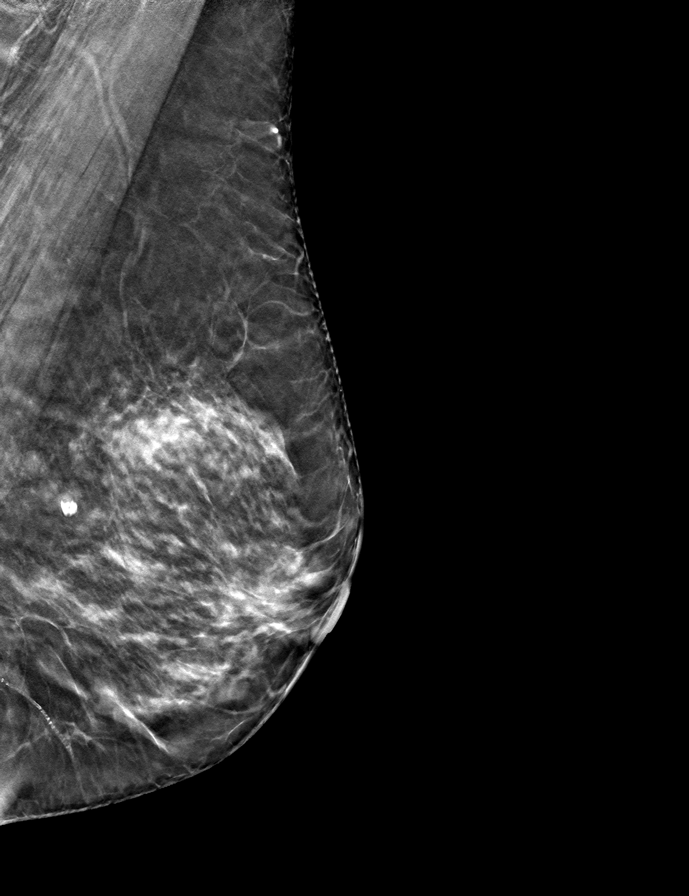

[9 of 24 positions shown; findings below may reference images not displayed]

ACR Breast Density Category c: The breast tissue is heterogeneously
dense, which may obscure small masses.
FINDINGS: There are no findings suspicious for malignancy. The images were
evaluated with computer-aided detection.
IMPRESSION: No mammographic evidence of malignancy. A result letter of this
screening mammogram will be mailed directly to the patient.

RECOMMENDATION:
Screening mammogram in one year. (Code:T4-5-GWO)

BI-RADS CATEGORY  1: Negative.

## 2022-10-13 ENCOUNTER — Telehealth: Payer: Self-pay | Admitting: Internal Medicine

## 2022-10-13 NOTE — Telephone Encounter (Signed)
Inbound call from patient she is trying to schedule a colonoscopy , she said she have to have one done every 5 years , however I do not see a recall for this patient. Can you please look at it and tell me on how to proceed?

## 2022-10-13 NOTE — Telephone Encounter (Signed)
Looks like Dr Marina Goodell discontinued additional recalls due to previous colonoscopy results showing NO precancerous changes and due to age. If she has any GI symptoms, obviously, we would like her to come to the office to discuss appropriate treatment/recommendations.    "12/05/2017 MRN: [id number]   Susan Logan [address] [place] [address]  Dear [name],  I am writing to inform you that the polyps removed from your colon were NOT pre-cancerous.   Given these favorable finding and your current age, no routine colorectal surveillance examination is recommended.   Should you develop new or worsening symptoms of abdominal pain, bowel habit changes, or bleeding form the rectum or bowels, please schedule an evaluation either with me or your primary care physician.   Please call us if you have persistent problems or have questions about your condition that have not been fully answered at this time.  Sincerely,   Wilhemina Bonito. Eda Keys., M.D. [facility] Division of Gastroenterology"

## 2023-01-24 ENCOUNTER — Encounter: Payer: Self-pay | Admitting: Internal Medicine

## 2023-01-24 DIAGNOSIS — Z87891 Personal history of nicotine dependence: Secondary | ICD-10-CM | POA: Diagnosis not present

## 2023-01-24 DIAGNOSIS — Z90710 Acquired absence of both cervix and uterus: Secondary | ICD-10-CM | POA: Diagnosis not present

## 2023-01-24 DIAGNOSIS — Z833 Family history of diabetes mellitus: Secondary | ICD-10-CM | POA: Diagnosis not present

## 2023-01-24 DIAGNOSIS — Z79890 Hormone replacement therapy (postmenopausal): Secondary | ICD-10-CM | POA: Diagnosis not present

## 2023-01-24 DIAGNOSIS — E039 Hypothyroidism, unspecified: Secondary | ICD-10-CM | POA: Diagnosis not present

## 2023-01-24 DIAGNOSIS — Z9049 Acquired absence of other specified parts of digestive tract: Secondary | ICD-10-CM | POA: Diagnosis not present

## 2023-01-24 DIAGNOSIS — E785 Hyperlipidemia, unspecified: Secondary | ICD-10-CM | POA: Diagnosis not present

## 2023-01-24 DIAGNOSIS — I10 Essential (primary) hypertension: Secondary | ICD-10-CM | POA: Diagnosis not present

## 2023-01-24 DIAGNOSIS — Z8249 Family history of ischemic heart disease and other diseases of the circulatory system: Secondary | ICD-10-CM | POA: Diagnosis not present

## 2023-05-01 ENCOUNTER — Ambulatory Visit: Payer: Medicare PPO | Admitting: Internal Medicine

## 2023-05-01 ENCOUNTER — Encounter: Payer: Self-pay | Admitting: Internal Medicine

## 2023-05-01 VITALS — BP 170/80 | HR 65 | Ht 65.0 in | Wt 140.0 lb

## 2023-05-01 DIAGNOSIS — Z800 Family history of malignant neoplasm of digestive organs: Secondary | ICD-10-CM | POA: Diagnosis not present

## 2023-05-01 DIAGNOSIS — K5730 Diverticulosis of large intestine without perforation or abscess without bleeding: Secondary | ICD-10-CM

## 2023-05-01 DIAGNOSIS — Z860101 Personal history of adenomatous and serrated colon polyps: Secondary | ICD-10-CM | POA: Diagnosis not present

## 2023-05-01 DIAGNOSIS — Z9049 Acquired absence of other specified parts of digestive tract: Secondary | ICD-10-CM

## 2023-05-01 DIAGNOSIS — Z860100 Personal history of colon polyps, unspecified: Secondary | ICD-10-CM

## 2023-05-01 MED ORDER — SUTAB 1479-225-188 MG PO TABS: 24.0000 | ORAL_TABLET | Freq: Once | ORAL | 0 refills | Status: AC

## 2023-05-01 NOTE — Progress Notes (Signed)
 HISTORY OF PRESENT ILLNESS:  Susan Logan is a 88 y.o. female with a history of hypertension and hypothyroidism who presents today regarding surveillance colonoscopy.  Patient has a history of multiple adenomatous colon polyps as well as a family history of colon cancer in her father around age 35 and her son at age 74.  She is also status post sigmoid colectomy secondary to complicated diverticular disease.  She has undergone multiple prior colonoscopies.  Most recent examination was August 2019.  She was found to have non-adenomatous polyp, diverticulosis, and prior sigmoid resection.  Follow-up in 5 years to be considered based on overall health.  Patient states she contacted the office to schedule her follow-up colonoscopy, which she is interested in having performed.  This appointment made.  She tells me that she has been in excellent health since her last exam.  Remains active.  No new medical problems or hospitalizations.  She is motivated to have this performed as she has a friend who decided to forego surveillance colonoscopy at age 56 only to develop colon cancer at age 77.  REVIEW OF SYSTEMS:  All non-GI ROS negative entirely Past Medical History:  Diagnosis Date   Cancer (HCC)    skin   Cataract    Colon polyp    Diverticulitis    Diverticulosis    Hypertension    Thyroid  disease     Past Surgical History:  Procedure Laterality Date   ABDOMINAL HYSTERECTOMY  04-1979   APPENDECTOMY  08-8042   BREAST EXCISIONAL BIOPSY Left 1976   cataract surgery  10-2010   COLON RESECTION  01-2001   diverticular dx   COLON RESECTION  2002   COLONOSCOPY     POLYPECTOMY     VAGINAL PROLAPSE REPAIR  12-2008    Social History Susan Logan  reports that she has quit smoking. She has never used smokeless tobacco. She reports that she does not currently use alcohol. She reports that she does not use drugs.  family history includes Colon cancer in her son; Colon cancer (age of onset: 109)  in her father; Diabetes in her mother; Hypertension in her mother; Liver cancer in her brother; Stroke in her mother.  No Known Allergies     PHYSICAL EXAMINATION: Vital signs: BP (!) 170/80   Pulse 65   Ht 5' 5 (1.651 m)   Wt 140 lb (63.5 kg)   BMI 23.30 kg/m   Constitutional: generally well-appearing, no acute distress Psychiatric: alert and oriented x3, cooperative Eyes: extraocular movements intact, anicteric, conjunctiva pink Mouth: oral pharynx moist, no lesions Neck: supple no lymphadenopathy Cardiovascular: heart regular rate and rhythm, no murmur Lungs: clear to auscultation bilaterally Abdomen: soft, nontender, nondistended, no obvious ascites, no peritoneal signs, normal bowel sounds, no organomegaly Rectal: Deferred until colonoscopy Extremities: no clubbing, cyanosis, or lower extremity edema bilaterally Skin: no lesions on visible extremities Neuro: No focal deficits.  Cranial nerves intact  ASSESSMENT:  1.  Personal history of adenomatous polyps 2.  Strong family history of colon cancer in 2 first-degree relatives 3.  History of complicated diverticular disease status post sigmoid colectomy   PLAN:  1.  Patient's overall health is excellent.  She is motivated and wants to undergo at least 1 more surveillance colonoscopy.  She is an appropriate candidate without contraindication.The nature of the procedure, as well as the risks, benefits, and alternatives were carefully and thoroughly reviewed with the patient. Ample time for discussion and questions allowed. The patient understood, was satisfied,  and agreed to proceed.

## 2023-05-01 NOTE — Patient Instructions (Signed)
 You have been scheduled for a colonoscopy. Please follow written instructions given to you at your visit today.   Please pick up your prep supplies at the pharmacy within the next 1-3 days.  If you use inhalers (even only as needed), please bring them with you on the day of your procedure.  DO NOT TAKE 7 DAYS PRIOR TO TEST- Trulicity (dulaglutide) Ozempic, Wegovy (semaglutide) Mounjaro (tirzepatide) Bydureon Bcise (exanatide extended release)  DO NOT TAKE 1 DAY PRIOR TO YOUR TEST Rybelsus (semaglutide) Adlyxin (lixisenatide) Victoza (liraglutide) Byetta (exanatide) ___________________________________________________________________________ Bonita Quin will receive your bowel preparation through [facility], which ensures the lowest copay and home delivery, with outreach via text or call from an [phone] number. Please respond promptly to avoid rescheduling. If you are interested in alternative options or have any questions please contact them at [phone]  Your Provider Has Sent Your Bowel Prep Regimen To [facility] What to expect. [facility] will contact you to verify your information and collect your copay, if applicable. Enjoy the comfort of your home while we deliver your prescription to you, free of any shipping charges. Fast, FREE delivery or shipping. [facility] accepts all major insurance benefits and applies discounts & coupons  Have additional questions? [facility]'s patient care team is always here to help.  Chat: [email or web address] Call: [phone] Email: [email or web address] NCPDP: [id number] How will we contact you? Welcome Phone call  a Welcome text and a Checkout link in a text Texts you receive from [phone] Are Not Spam.   *To set up delivery, you must complete the checkout process via link or speak to one of our patient care representatives. If we are unable to reach you, your prescription may be delayed.

## 2023-06-06 ENCOUNTER — Encounter: Payer: Self-pay | Admitting: Internal Medicine

## 2023-06-14 ENCOUNTER — Ambulatory Visit (AMBULATORY_SURGERY_CENTER): Payer: Medicare PPO | Admitting: Internal Medicine

## 2023-06-14 ENCOUNTER — Encounter: Payer: Medicare PPO | Admitting: Internal Medicine

## 2023-06-14 ENCOUNTER — Encounter: Payer: Self-pay | Admitting: Internal Medicine

## 2023-06-14 VITALS — BP 179/93 | HR 62 | Temp 97.7°F | Resp 10 | Ht 65.0 in | Wt 140.0 lb

## 2023-06-14 DIAGNOSIS — K5730 Diverticulosis of large intestine without perforation or abscess without bleeding: Secondary | ICD-10-CM | POA: Diagnosis not present

## 2023-06-14 DIAGNOSIS — Z1211 Encounter for screening for malignant neoplasm of colon: Secondary | ICD-10-CM | POA: Diagnosis not present

## 2023-06-14 DIAGNOSIS — Z800 Family history of malignant neoplasm of digestive organs: Secondary | ICD-10-CM

## 2023-06-14 DIAGNOSIS — Z860100 Personal history of colon polyps, unspecified: Secondary | ICD-10-CM

## 2023-06-14 DIAGNOSIS — K6389 Other specified diseases of intestine: Secondary | ICD-10-CM | POA: Diagnosis not present

## 2023-06-14 MED ORDER — SODIUM CHLORIDE 0.9 % IV SOLN: 500.0000 mL | Freq: Once | INTRAVENOUS | Status: DC

## 2023-06-14 NOTE — Progress Notes (Signed)
 Vss nad trans to pacu

## 2023-06-14 NOTE — Patient Instructions (Signed)
 Resume previous diet and medications. No repeat Colonoscopy due to age. Handout provided on Diverticulosis  YOU HAD AN ENDOSCOPIC PROCEDURE TODAY AT THE [facility]:   Refer to the procedure report that was given to you for any specific questions about what was found during the examination.  If the procedure report does not answer your questions, please call your gastroenterologist to clarify.  If you requested that your care partner not be given the details of your procedure findings, then the procedure report has been included in a sealed envelope for you to review at your convenience later.  YOU SHOULD EXPECT: Some feelings of bloating in the abdomen. Passage of more gas than usual.  Walking can help get rid of the air that was put into your GI tract during the procedure and reduce the bloating. If you had a lower endoscopy (such as a colonoscopy or flexible sigmoidoscopy) you may notice spotting of blood in your stool or on the toilet paper. If you underwent a bowel prep for your procedure, you may not have a normal bowel movement for a few days.  Please Note:  You might notice some irritation and congestion in your nose or some drainage.  This is from the oxygen used during your procedure.  There is no need for concern and it should clear up in a day or so.  SYMPTOMS TO REPORT IMMEDIATELY:  Following lower endoscopy (colonoscopy or flexible sigmoidoscopy):  Excessive amounts of blood in the stool  Significant tenderness or worsening of abdominal pains  Swelling of the abdomen that is new, acute  Fever of 100F or higher  For urgent or emergent issues, a gastroenterologist can be reached at any hour by calling [phone]. Do not use MyChart messaging for urgent concerns.    DIET:  We do recommend a small meal at first, but then you may proceed to your regular diet.  Drink plenty of fluids but you should avoid alcoholic beverages for 24 hours.  ACTIVITY:  You should plan to  take it easy for the rest of today and you should NOT DRIVE or use heavy machinery until tomorrow (because of the sedation medicines used during the test).    FOLLOW UP: Our staff will call the number listed on your records the next business day following your procedure.  We will call around 7:15- 8:00 am to check on you and address any questions or concerns that you may have regarding the information given to you following your procedure. If we do not reach you, we will leave a message.     If any biopsies were taken you will be contacted by phone or by letter within the next 1-3 weeks.  Please call us at [phone] if you have not heard about the biopsies in 3 weeks.    SIGNATURES/CONFIDENTIALITY: You and/or your care partner have signed paperwork which will be entered into your electronic medical record.  These signatures attest to the fact that that the information above on your After Visit Summary has been reviewed and is understood.  Full responsibility of the confidentiality of this discharge information lies with you and/or your care-partner.

## 2023-06-14 NOTE — Op Note (Signed)
 [facility] Patient Name: Susan Logan Procedure Date: 06/14/2023 11:50 AM MRN: [id number] Endoscopist: Wilhemina Bonito. Marina Goodell , MD, [id number] Age: 88 Referring MD:  Date of Birth: June 17, 1935 Gender: Female Account #: [id number] Procedure:                Colonoscopy Indications:              High risk colon cancer surveillance: Personal                            history of multiple (3 or more) adenomas. Multiple                            prior examinations. Most recently 2019. Also father                            and son with a history of colon cancer Medicines:                Monitored Anesthesia Care Procedure:                Pre-Anesthesia Assessment:                           - Prior to the procedure, a History and Physical                            was performed, and patient medications and                            allergies were reviewed. The patient's tolerance of                            previous anesthesia was also reviewed. The risks                            and benefits of the procedure and the sedation                            options and risks were discussed with the patient.                            All questions were answered, and informed consent                            was obtained. Prior Anticoagulants: The patient has                            taken no anticoagulant or antiplatelet agents. ASA                            Grade Assessment: II - A patient with mild systemic                            disease. After reviewing the risks and benefits,  the patient was deemed in satisfactory condition to                            undergo the procedure.                           After obtaining informed consent, the colonoscope                            was passed under direct vision. Throughout the                            procedure, the patient's blood pressure, pulse, and                            oxygen saturations  were monitored continuously. The                            Olympus Scope [id number] was introduced through the                            anus and advanced to the the cecum, identified by                            appendiceal orifice and ileocecal valve. The                            ileocecal valve, appendiceal orifice, and rectum                            were photographed. The quality of the bowel                            preparation was excellent. The colonoscopy was                            performed without difficulty. The patient tolerated                            the procedure well. The bowel preparation used was                            SUPREP via split dose instruction. Scope In: 12:00:04 PM Scope Out: 12:10:05 PM Scope Withdrawal Time: 0 hours 7 minutes 27 seconds  Total Procedure Duration: 0 hours 10 minutes 1 second  Findings:                 Multiple diverticula were found in the left colon.                            Melanosis coli.                           The exam was otherwise without abnormality on  direct and retroflexion views. Healthy anastomosis                            at 18 cm from the anal verge. Complications:            No immediate complications. Estimated blood loss:                            None. Estimated Blood Loss:     Estimated blood loss: none. Impression:               - Diverticulosis in the left colon. Melanosis.                           - The examination was otherwise normal on direct                            and retroflexion views.                           - Status post sigmoid resection. Recommendation:           - Repeat colonoscopy is not recommended for                            surveillance.                           - Patient has a contact number available for                            emergencies. The signs and symptoms of potential                            delayed complications were  discussed with the                            patient. Return to normal activities tomorrow.                            Written discharge instructions were provided to the                            patient.                           - Resume previous diet.                           - Continue present medications. Wilhemina Bonito. Marina Goodell, MD 06/14/2023 12:16:34 PM This report has been signed electronically.

## 2023-06-14 NOTE — Progress Notes (Signed)
 Expand All Collapse All HISTORY OF PRESENT ILLNESS:   Susan Logan is a 88 y.o. female with a history of hypertension and hypothyroidism who presents today regarding surveillance colonoscopy.   Patient has a history of multiple adenomatous colon polyps as well as a family history of colon cancer in her father around age 7 and her son at age 41.  She is also status post sigmoid colectomy secondary to complicated diverticular disease.  She has undergone multiple prior colonoscopies.  Most recent examination was August 2019.  She was found to have non-adenomatous polyp, diverticulosis, and prior sigmoid resection.  Follow-up in 5 years to be considered based on overall health.   Patient states she contacted the office to schedule her follow-up colonoscopy, which she is interested in having performed.  This appointment made.  She tells me that she has been in excellent health since her last exam.  Remains active.  No new medical problems or hospitalizations.  She is motivated to have this performed as she has a friend who decided to forego surveillance colonoscopy at age 30 only to develop colon cancer at age 52.   REVIEW OF SYSTEMS:   All non-GI ROS negative entirely     Past Medical History:  Diagnosis Date   Cancer (HCC)      skin   Cataract     Colon polyp     Diverticulitis     Diverticulosis     Hypertension     Thyroid disease                 Past Surgical History:  Procedure Laterality Date   ABDOMINAL HYSTERECTOMY   04-1979   APPENDECTOMY   0-8657   BREAST EXCISIONAL BIOPSY Left 1976   cataract surgery   10-2010   COLON RESECTION   01-2001    diverticular dx   COLON RESECTION   2002   COLONOSCOPY       POLYPECTOMY       VAGINAL PROLAPSE REPAIR   12-2008          Social History Susan Logan  reports that she has quit smoking. She has never used smokeless tobacco. She reports that she does not currently use alcohol. She reports that she does not use drugs.    family history includes Colon cancer in her son; Colon cancer (age of onset: 23) in her father; Diabetes in her mother; Hypertension in her mother; Liver cancer in her brother; Stroke in her mother.   Allergies  No Known Allergies         PHYSICAL EXAMINATION: Vital signs: BP (!) 170/80   Pulse 65   Ht 5\' 5"  (1.651 m)   Wt 140 lb (63.5 kg)   BMI 23.30 kg/m   Constitutional: generally well-appearing, no acute distress Psychiatric: alert and oriented x3, cooperative Eyes: extraocular movements intact, anicteric, conjunctiva pink Mouth: oral pharynx moist, no lesions Neck: supple no lymphadenopathy Cardiovascular: heart regular rate and rhythm, no murmur Lungs: clear to auscultation bilaterally Abdomen: soft, nontender, nondistended, no obvious ascites, no peritoneal signs, normal bowel sounds, no organomegaly Rectal: Deferred until colonoscopy Extremities: no clubbing, cyanosis, or lower extremity edema bilaterally Skin: no lesions on visible extremities Neuro: No focal deficits.  Cranial nerves intact   ASSESSMENT:   1.  Personal history of adenomatous polyps 2.  Strong family history of colon cancer in 2 first-degree relatives 3.  History of complicated diverticular disease status post sigmoid colectomy     PLAN:  1.  Patient's overall health is excellent.  She is motivated and wants to undergo at least 1 more surveillance colonoscopy.  She is an appropriate candidate without contraindication.The nature of the procedure, as well as the risks, benefits, and alternatives were carefully and thoroughly reviewed with the patient. Ample time for discussion and questions allowed. The patient understood, was satisfied, and agreed to proceed.   Recent H&P as above, no interval change

## 2023-06-14 NOTE — Progress Notes (Signed)
 Pt's states no medical or surgical changes since previsit or office visit.

## 2023-06-15 ENCOUNTER — Telehealth: Payer: Self-pay

## 2023-06-15 NOTE — Telephone Encounter (Signed)
 Post procedure follow up call, no answer

## 2023-06-29 ENCOUNTER — Other Ambulatory Visit: Payer: Self-pay | Admitting: Family Medicine

## 2023-06-29 DIAGNOSIS — Z1231 Encounter for screening mammogram for malignant neoplasm of breast: Secondary | ICD-10-CM

## 2023-07-31 ENCOUNTER — Ambulatory Visit
Admission: RE | Admit: 2023-07-31 | Discharge: 2023-07-31 | Disposition: A | Discharge status: Home / Self Care | Source: Ambulatory Visit | Attending: Family Medicine | Admitting: Family Medicine

## 2023-07-31 DIAGNOSIS — Z1231 Encounter for screening mammogram for malignant neoplasm of breast: Secondary | ICD-10-CM

## 2023-10-22 DIAGNOSIS — S61451A Open bite of right hand, initial encounter: Secondary | ICD-10-CM | POA: Diagnosis not present

## 2023-10-22 DIAGNOSIS — M79641 Pain in right hand: Secondary | ICD-10-CM | POA: Diagnosis not present

## 2023-10-24 DIAGNOSIS — S61451D Open bite of right hand, subsequent encounter: Secondary | ICD-10-CM | POA: Diagnosis not present

## 2023-10-24 DIAGNOSIS — W5501XD Bitten by cat, subsequent encounter: Secondary | ICD-10-CM | POA: Diagnosis not present

## 2023-11-23 DIAGNOSIS — Z6822 Body mass index (BMI) 22.0-22.9, adult: Secondary | ICD-10-CM | POA: Diagnosis not present

## 2023-11-23 DIAGNOSIS — Z1331 Encounter for screening for depression: Secondary | ICD-10-CM | POA: Diagnosis not present

## 2023-11-23 DIAGNOSIS — E039 Hypothyroidism, unspecified: Secondary | ICD-10-CM | POA: Diagnosis not present

## 2023-11-23 DIAGNOSIS — Z0000 Encounter for general adult medical examination without abnormal findings: Secondary | ICD-10-CM | POA: Diagnosis not present

## 2023-11-23 DIAGNOSIS — Z1339 Encounter for screening examination for other mental health and behavioral disorders: Secondary | ICD-10-CM | POA: Diagnosis not present

## 2023-11-23 DIAGNOSIS — I10 Essential (primary) hypertension: Secondary | ICD-10-CM | POA: Diagnosis not present

## 2023-11-23 DIAGNOSIS — Z79899 Other long term (current) drug therapy: Secondary | ICD-10-CM | POA: Diagnosis not present
